# Patient Record
Sex: Female | Born: 1995 | Race: Black or African American | Hispanic: No | Marital: Single | Smoking: Never smoker
Health system: Southern US, Community
[De-identification: ages and names within clinical notes are randomized; demographics above are authoritative.]

---

## 2016-08-09 ENCOUNTER — Encounter (HOSPITAL_COMMUNITY): Payer: Self-pay | Admitting: *Deleted

## 2016-08-09 ENCOUNTER — Ambulatory Visit (HOSPITAL_COMMUNITY)
Admission: EM | Admit: 2016-08-09 | Discharge: 2016-08-09 | Disposition: A | Discharge status: Home / Self Care | Payer: Self-pay | Attending: Family Medicine | Admitting: Family Medicine

## 2016-08-09 DIAGNOSIS — M791 Myalgia: Secondary | ICD-10-CM

## 2016-08-09 DIAGNOSIS — M7918 Myalgia, other site: Secondary | ICD-10-CM

## 2016-08-09 MED ORDER — DICLOFENAC SODIUM 75 MG PO TBEC: 75.0000 mg | DELAYED_RELEASE_TABLET | Freq: Two times a day (BID) | ORAL | 0 refills | Status: DC

## 2016-08-09 MED ORDER — METHOCARBAMOL 500 MG PO TABS: 500.0000 mg | ORAL_TABLET | Freq: Two times a day (BID) | ORAL | 0 refills | Status: DC
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
## 2017-09-01 ENCOUNTER — Emergency Department (HOSPITAL_COMMUNITY): Payer: Self-pay

## 2017-09-01 ENCOUNTER — Emergency Department (HOSPITAL_COMMUNITY)
Admission: EM | Admit: 2017-09-01 | Discharge: 2017-09-02 | Disposition: A | Discharge status: Home / Self Care | Payer: Self-pay | Attending: Emergency Medicine | Admitting: Emergency Medicine

## 2017-09-01 ENCOUNTER — Encounter (HOSPITAL_COMMUNITY): Payer: Self-pay | Admitting: Nurse Practitioner

## 2017-09-01 DIAGNOSIS — R0789 Other chest pain: Secondary | ICD-10-CM | POA: Insufficient documentation

## 2017-09-01 LAB — BASIC METABOLIC PANEL
ANION GAP: 8 (ref 5–15)
BUN: 11 mg/dL (ref 6–20)
CALCIUM: 9.2 mg/dL (ref 8.9–10.3)
CO2: 24 mmol/L (ref 22–32)
CREATININE: 0.84 mg/dL (ref 0.44–1.00)
Chloride: 108 mmol/L (ref 101–111)
GFR calc Af Amer: >60 mL/min (ref >60)
GFR calc non Af Amer: >60 mL/min (ref >60)
GLUCOSE: 97 mg/dL (ref 65–99)
Potassium: 3.5 mmol/L (ref 3.5–5.1)
Sodium: 140 mmol/L (ref 135–145)

## 2017-09-01 LAB — CBC
HCT: 35.7 % — ABNORMAL LOW (ref 36.0–46.0)
HEMOGLOBIN: 12.2 g/dL (ref 12.0–15.0)
MCH: 32 pg (ref 26.0–34.0)
MCHC: 34.2 g/dL (ref 30.0–36.0)
MCV: 93.7 fL (ref 78.0–100.0)
Platelets: 201 10*3/uL (ref 150–400)
RBC: 3.81 MIL/uL — ABNORMAL LOW (ref 3.87–5.11)
RDW: 13.2 % (ref 11.5–15.5)
WBC: 7 10*3/uL (ref 4.0–10.5)

## 2017-09-01 LAB — I-STAT TROPONIN, ED: TROPONIN I, POC: 0 ng/mL (ref 0.00–0.08)

## 2017-09-01 LAB — I-STAT BETA HCG BLOOD, ED (MC, WL, AP ONLY): I-stat hCG, quantitative: <5 m[IU]/mL (ref <5)

## 2017-09-01 NOTE — ED Triage Notes (Signed)
Pt states she has been having chest pain for the last 2 days, worsens with movement, does not radiate.

## 2017-09-02 MED ORDER — NAPROXEN 500 MG PO TABS: 500.0000 mg | ORAL_TABLET | Freq: Two times a day (BID) | ORAL | 0 refills | Status: AC

## 2017-09-02 NOTE — Discharge Instructions (Signed)
Take the prescribed medication as directed. °Follow-up with your primary care doctor if any ongoing issues. °Return to the ED for new or worsening symptoms. °

## 2017-09-02 NOTE — ED Provider Notes (Signed)
[facility] Provider Note   CSN: [id number] Arrival date & time: 09/01/17  2145     History   Chief Complaint Chief Complaint  Patient presents with  . Chest Pain    HPI Tiffany Quinn is a 22 y.o. female.  The history is provided by the patient and medical records.  Chest Pain       22 year old female with no significant past medical history presenting to the ED with left-sided chest pain.  Reports this began last night and has been constant since onset.  Pain is dull, aching in nature without any radiation.  She denies any associated shortness of breath, diaphoresis, nausea, vomiting, palpitations, dizziness, or weakness.  States pain is worse with moving her left arm or picking things up.  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  She denies any known cardiac history.  She is not a smoker.  She did take some Tylenol last night with some mild relief in her pain but it returned this morning.  History reviewed. No pertinent past medical history.  There are no active problems to display for this patient.   History reviewed. No pertinent surgical history.  OB History    No data available       Home Medications    Prior to Admission medications   Not on File    Family History History reviewed. No pertinent family history.  Social History Social History   Tobacco Use  . Smoking status: Never Smoker  . Smokeless tobacco: Never Used  Substance Use Topics  . Alcohol use: Not on file  . Drug use: Not on file     Allergies   Hydrocodone   Review of Systems Review of Systems  Cardiovascular: Positive for chest pain.  All other systems reviewed and are negative.    Physical Exam Updated Vital Signs BP 127/74 (BP Location: Right Arm)   Pulse 77   Temp 98.1 F (36.7 C) (Oral)   Resp 17   Ht 5\' 2"  (1.575 m)   Wt 98.9 kg (218 lb)   LMP 08/18/2017   SpO2 98%   BMI 39.87 kg/m    Physical Exam  Constitutional: She is oriented to person, place, and time. She appears well-developed and well-nourished.  HENT:  Head: Normocephalic and atraumatic.  Mouth/Throat: Oropharynx is clear and moist.  Eyes: Conjunctivae and EOM are normal. Pupils are equal, round, and reactive to light.  Neck: Normal range of motion.  Cardiovascular: Normal rate, regular rhythm and normal heart sounds.  Pulmonary/Chest: Effort normal and breath sounds normal. She has no decreased breath sounds. She has no wheezes.  Abdominal: Soft. Bowel sounds are normal.  Musculoskeletal: Normal range of motion.  Neurological: She is alert and oriented to person, place, and time.  Skin: Skin is warm and dry.  Psychiatric: She has a normal mood and affect.  Nursing note and vitals reviewed.    ED Treatments / Results  Labs (all labs ordered are listed, but only abnormal results are displayed) Labs Reviewed  CBC - Abnormal; Notable for the following components:      Result Value   RBC 3.81 (*)    HCT 35.7 (*)    All other components within normal limits  BASIC METABOLIC PANEL  I-STAT TROPONIN, ED  I-STAT BETA HCG BLOOD, ED ([facility], [facility], [facility] ONLY)    EKG  EKG Interpretation  Date/Time:  Friday September 01 2017 22:27:22 EST Ventricular  Rate:  78 PR Interval:    QRS Duration: 86 QT Interval:  368 QTC Calculation: 420 R Axis:   83 Text Interpretation:  Sinus rhythm Borderline T wave abnormalities No old tracing to compare Confirmed by Ward, Baxter Hire [phone]) on 09/02/2017 12:47:47 AM       Radiology Dg Chest 2 View  Result Date: 09/01/2017 CLINICAL DATA:  Chest pain, onset last night. EXAM: CHEST  2 VIEW COMPARISON:  None. FINDINGS: The cardiomediastinal contours are normal. The lungs are clear. Pulmonary vasculature is normal. No consolidation, pleural effusion, or pneumothorax. No acute osseous abnormalities are seen. IMPRESSION: No acute pulmonary process. Electronically Signed   By: Rubye Oaks M.D.   On: 09/01/2017 23:07    Procedures Procedures (including critical care time)  Medications Ordered in ED Medications - No data to display   Initial Impression / Assessment and Plan / ED Course  I have reviewed the triage vital signs and the nursing notes.  Pertinent labs & imaging results that were available during my care of the patient were reviewed by me and considered in my medical decision making (see chart for details).  22 year old female who with left-sided chest pain since last night.  She is afebrile and nontoxic.  Workup including EKG, labs, troponin, chest x-ray all within normal limits.  Patient has no significant cardiac risk factors.  She is PERC negative.  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  Lower suspicion for ACS, PE, dissection, acute cardiac event.  Will start anti-inflammatories.  Feel she is stable for discharge home.  She was given work note.  She understands to return here for any new or worsening symptoms.  Final Clinical Impressions(s) / ED Diagnoses   Final diagnoses:  Chest wall pain    ED Discharge Orders        Ordered    naproxen (NAPROSYN) 500 MG tablet  2 times daily with meals     09/02/17 0058       Garlon Hatchet, PA-C 09/02/17 0106    Ward, Layla Maw, DO 09/02/17 0111

## 2018-06-30 IMAGING — CR DG CHEST 2V
2 series · 2 of 2 positions shown · non-contrast
Comparison: None.

CLINICAL DATA: Chest pain, onset last night.

EXAM:
CHEST  2 VIEW

[w chest pa]
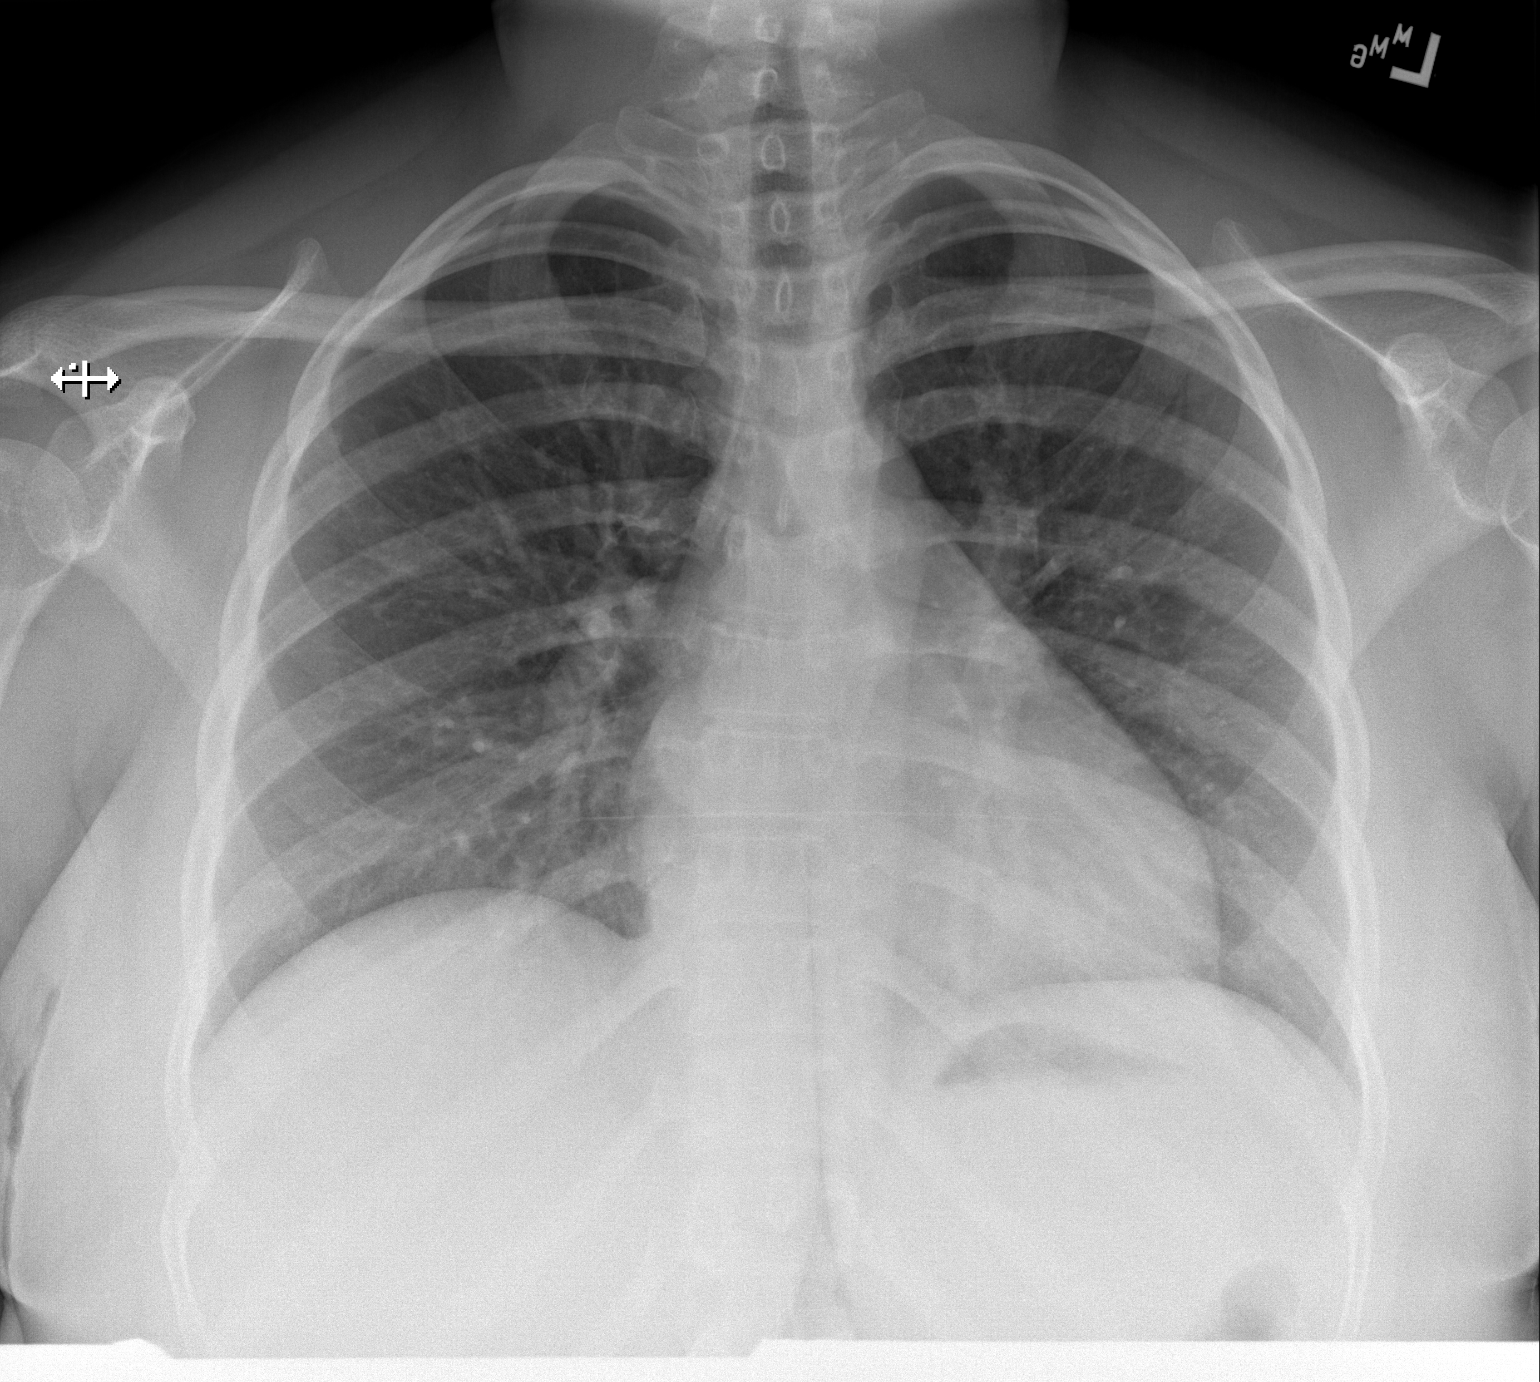

[w chest lat]
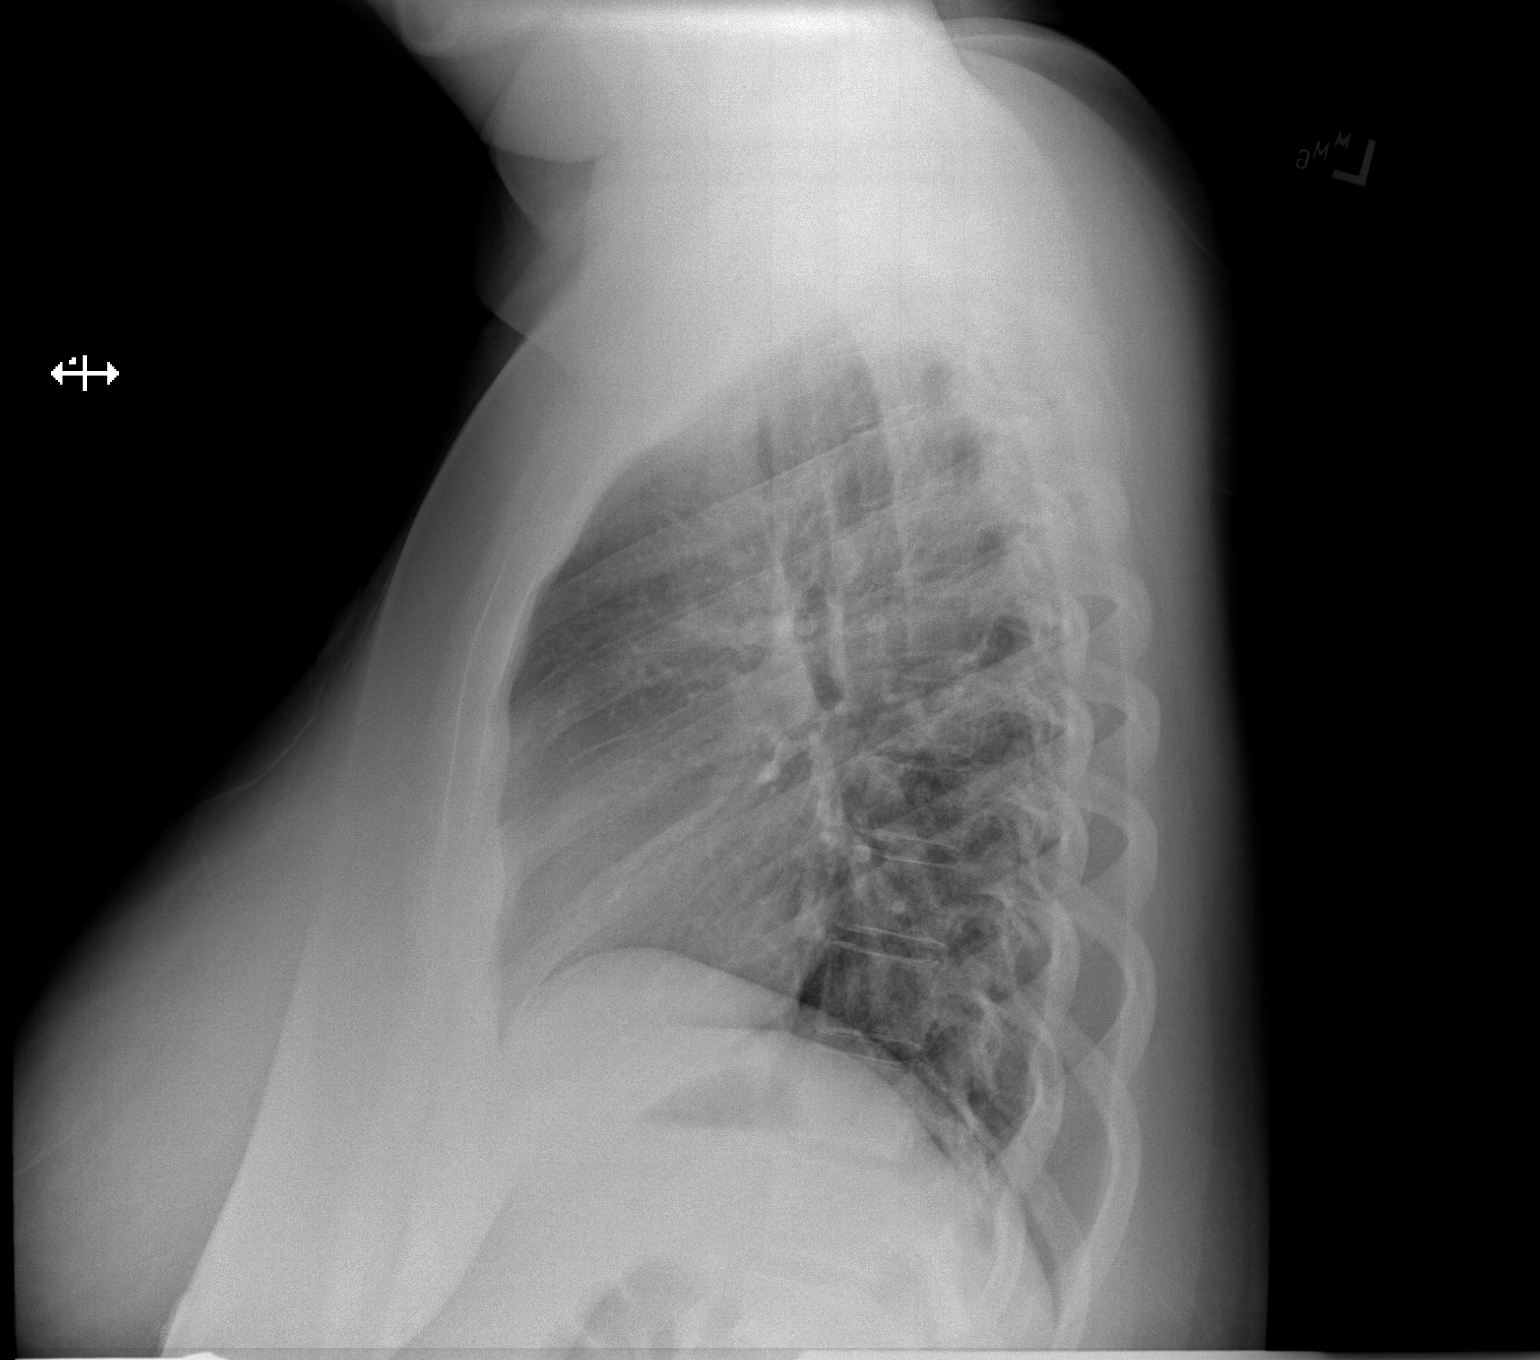

[2 of 2 positions shown; findings below may reference images not displayed]

FINDINGS: The cardiomediastinal contours are normal. The lungs are clear.
Pulmonary vasculature is normal. No consolidation, pleural effusion,
or pneumothorax. No acute osseous abnormalities are seen.
IMPRESSION: No acute pulmonary process.
